# Patient Record
Sex: Male | Born: 1995 | Race: Black or African American | Hispanic: No | Marital: Single | Smoking: Current every day smoker
Health system: Southern US, Community
[De-identification: ages and names within clinical notes are randomized; demographics above are authoritative.]

## PROBLEM LIST (undated history)

## (undated) DIAGNOSIS — J45909 Unspecified asthma, uncomplicated: Secondary | ICD-10-CM

---
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
## 2015-03-21 ENCOUNTER — Emergency Department (HOSPITAL_COMMUNITY)
Admission: EM | Admit: 2015-03-21 | Discharge: 2015-03-21 | Disposition: A | Discharge status: Home / Self Care | Payer: Self-pay | Attending: Emergency Medicine | Admitting: Emergency Medicine

## 2015-03-21 ENCOUNTER — Encounter (HOSPITAL_COMMUNITY): Payer: Self-pay | Admitting: Nurse Practitioner

## 2015-03-21 ENCOUNTER — Emergency Department (HOSPITAL_COMMUNITY): Payer: Self-pay

## 2015-03-21 DIAGNOSIS — Y9289 Other specified places as the place of occurrence of the external cause: Secondary | ICD-10-CM | POA: Insufficient documentation

## 2015-03-21 DIAGNOSIS — W228XXA Striking against or struck by other objects, initial encounter: Secondary | ICD-10-CM | POA: Insufficient documentation

## 2015-03-21 DIAGNOSIS — S62326A Displaced fracture of shaft of fifth metacarpal bone, right hand, initial encounter for closed fracture: Secondary | ICD-10-CM | POA: Insufficient documentation

## 2015-03-21 DIAGNOSIS — Y998 Other external cause status: Secondary | ICD-10-CM | POA: Insufficient documentation

## 2015-03-21 DIAGNOSIS — J45909 Unspecified asthma, uncomplicated: Secondary | ICD-10-CM | POA: Insufficient documentation

## 2015-03-21 DIAGNOSIS — S62339K Displaced fracture of neck of unspecified metacarpal bone, subsequent encounter for fracture with nonunion: Secondary | ICD-10-CM

## 2015-03-21 DIAGNOSIS — Z720 Tobacco use: Secondary | ICD-10-CM | POA: Insufficient documentation

## 2015-03-21 DIAGNOSIS — Y9389 Activity, other specified: Secondary | ICD-10-CM | POA: Insufficient documentation

## 2015-03-21 MED ORDER — HYDROCODONE-ACETAMINOPHEN 5-325 MG PO TABS: 1.0000 | ORAL_TABLET | Freq: Four times a day (QID) | ORAL | Status: DC | PRN

## 2015-03-21 NOTE — ED Notes (Signed)
Pt had R hand boxer fracture several months ago and has re-injured it several times since and would like the doctor to check the hand today before he works with it. No pain now. Does have some intermittent pain. cms inact

## 2015-03-21 NOTE — Discharge Instructions (Signed)
You have a Boxer's fracture that has not healed appropriately.  Please wear splint and follow up closely with Dr. Mina Marble for further care.    Boxer's Fracture A boxer's fracture is a break (fracture) of the bone in your hand that connects your little finger to your wrist (fifth metacarpal). This type of fracture usually happens at the end of the bone, closest to the little finger. The knuckle is often pushed down by the impact. In some cases, only a splint or brace is needed, or you may need a cast. Casting or splinting may include taping your injured finger to the next finger (buddy taping). You may need surgery to repair the fracture. This may involve the use of wires, screws, or plates to hold the bone pieces in place.  CAUSES This injury may be caused by:   Hitting an object with a clenched fist.  A hard, direct hit to the hand.  An injury that crushes the hand. RISK FACTORS This injury is more likely to occur if:  You are in a fistfight.  You have certain bone diseases. SYMPTOMS Symptoms of this type of fracture develop soon after the injury. Symptoms may include:  Swelling of the hand.  Pain.  Pain when moving the fifth finger or touching the hand.  Abnormal position of the finger.  Not being able to move the finger.  A shortened finger.  A finger knuckle that looks sunken in. DIAGNOSIS This injury may be diagnosed based on your symptoms, especially if you had a recent hand injury. Your health care provider will perform a physical exam, and you may also have X-rays to confirm the diagnosis. TREATMENT  Treatment for this injury depends on how severe it is. Possible treatments include:  Closed reduction. If your bone is stable and can be moved back into place, you may only need to wear a cast or splint or have buddy taping.  Open reduction with internal fixation (ORIF). This may be needed if your fracture is far out of place or goes through the joint surface of the  bone. This treatment involves open surgery to move your bones back into the right position. Screws, wires, or plates may be used to stabilize the fracture. You may need to wear a cast or a splint for several weeks. You will also need to have follow-up X-rays to make sure that the bone is healing well and staying in position. After you no longer need the cast or splint, you may need physical therapy. This will help you to regain full movement and strength in your hand.  HOME CARE INSTRUCTIONS If You Have a Cast:  Do not stick anything inside the cast to scratch your skin. Doing that increases your risk of infection.  Check the skin around the cast every day. Report any concerns to your health care provider. You may put lotion on dry skin around the edges of the cast. Do not apply lotion to the skin underneath the cast. If You Have a Splint:  Wear it as directed by your health care provider. Remove it only as directed by your health care provider.  Loosen the splint if your fingers become numb and tingle, or if they turn cold and blue. Bathing  Cover the cast or splint with a watertight plastic bag to protect it from water while you take a bath or a shower. Do not let the cast or splint get wet. Managing Pain, Stiffness, and Swelling  If directed, apply ice to the  injured area (if you have a splint, not a cast):  Put ice in a plastic bag.  Place a towel between your skin and the bag.  Leave the ice on for 20 minutes, 2-3 times a day.  Move your fingers often to avoid stiffness and to lessen swelling.  Raise the injured area above the level of your heart while you are sitting or lying down. Driving  Do not drive or operate heavy machinery while taking pain medicine.  Do not drive while wearing a cast or splint on a hand or foot that you use for driving. Activity  Return to your normal activities as directed by your health care provider. Ask your health care provider what activities  are safe for you. General Instructions  Do not put pressure on any part of the cast or splint until it is fully hardened. This may take several hours.  Keep the cast or splint clean and dry.  Do not use any tobacco products, including cigarettes, chewing tobacco, or electronic cigarettes. Tobacco can delay bone healing. If you need help quitting, ask your health care provider.  Take medicines only as directed by your health care provider.  Keep all follow-up visits as directed by your health care provider. This is important. SEEK MEDICAL CARE IF:  Your pain is getting worse.  You have redness, swelling, or pain in the injured area.  You have fluid, blood, or pus coming from under your cast or splint.  You notice a bad smell coming from under your cast or splint.  You have a fever.  Your cast or splint feels too tight or too loose.  You cast is coming apart. SEEK IMMEDIATE MEDICAL CARE IF:  You develop a rash.  You have trouble breathing.  Your skin or nails on your injured hand turn blue or gray even after you loosen your splint.  Your injured hand feels cold or becomes numb even after you loosen your splint.  You develop severe pain under the cast or in your hand.   This information is not intended to replace advice given to you by your health care provider. Make sure you discuss any questions you have with your health care provider.   Document Released: 04/26/2005 Document Revised: 01/15/2015 Document Reviewed: 02/13/2014 Elsevier Interactive Patient Education [employer] Elsevier Inc.

## 2015-03-21 NOTE — ED Provider Notes (Signed)
CSN: [id number]     Arrival date & time 03/21/15  1617 History  By signing my name below, I, Marc Powers, attest that this documentation has been prepared under the direction and in the presence of Marc Helper, PA-C. Electronically Signed: Elon Powers ED Scribe. 03/21/2015. 5:02 PM.    No chief complaint on file.  The history is provided by the patient. No language interpreter was used.   HPI Comments: Marc Powers is a 19 y.o. right hand dominant male who presents to the Emergency Department complaining of intermittently sharp/throbbing right hand pain onset 2-3 weeks ago.  The patient reports punching a dresser several months ago  He was dx'd with boxer's fracture which was splinted and subsequently plated by Dr. Mina Marble.  The hand was heeling well until 2-3 weeks ago, when the patient struck is hand accidentally on a steel doorframe, causing his current complaint.  His primary motivator for coming to the ED today is to obtain imaging to ensure he did not refracture the hand.  He denies numbness.    Past Medical History  Diagnosis Date  . Asthma     as child   Past Surgical History  Procedure Laterality Date  . Open reduction internal fixation (orif) metacarpal Right 10/30/2014    Procedure: OPEN REDUCTION INTERNAL FIXATION (ORIF)RIGHT SMALL  METACARPAL;  Surgeon: Marc Ponder, MD;  Location: [facility];  Service: Orthopedics;  Laterality: Right;   No family history on file. Social History  Substance Use Topics  . Smoking status: Current Every Day Smoker -- 0.25 packs/day    Types: Cigarettes  . Smokeless tobacco: Not on file  . Alcohol Use: No    Review of Systems  Constitutional: Negative for fever.  Musculoskeletal: Positive for arthralgias.      Allergies  Review of patient's allergies indicates no known allergies.  Home Medications   Prior to Admission medications   Medication Sig Start Date End Date Taking? Authorizing Provider   oxyCODONE-acetaminophen (ROXICET) 5-325 MG per tablet Take 1 tablet by mouth every 4 (four) hours as needed for severe pain. 10/30/14   Marc Ponder, MD   There were no vitals taken for this visit. Physical Exam  Constitutional: He is oriented to person, place, and time. He appears well-developed and well-nourished. No distress.  HENT:  Head: Normocephalic and atraumatic.  Eyes: Conjunctivae and EOM are normal.  Neck: Neck supple. No tracheal deviation present.  Cardiovascular: Normal rate.   Pulmonary/Chest: Effort normal. No respiratory distress.  Musculoskeletal: Normal range of motion.  Right wrist: with FROM.  Radial pulse is 2+.   Right hand: FROM.  No reproducible pain.  Brisk cap refill.  Well-healing surgical scar at the 5th metacarpal.  No signs of infection.     Neurological: He is alert and oriented to person, place, and time.  Skin: Skin is warm and dry.  Psychiatric: He has a normal mood and affect. His behavior is normal.  Nursing note and vitals reviewed.   ED Course  Procedures (including critical care time)  DIAGNOSTIC STUDIES: Oxygen Saturation is 99% on RA, normal by my interpretation.    COORDINATION OF CARE:   4:44 PM Discussed plan to obtain imaging of right hand.  Patient acknowledges and agrees with plan.   5:13 PM Pt has reinjured his R hand after suffering a boxer's fracture recently.  Xray demonstrates nonunion of the fifth metacarpal fx which may be due to reinjury, persistent movement or to delay healing. Thefore, pt will  be placed in an ulnar gutter and will need to f/u with Dr. Mina MarbleWeingold outpt for further care.  Pt made aware of plan.     5:20 PM Pt sts he would like to use his splint at home and agrees to f;u with DR. Weingold next week.  Imaging Review Dg Hand Complete Right  03/21/2015  CLINICAL DATA:  The patient had surgery for a boxer's a fracture in June of 2016; new injury of the right hand when a door with slammed on the hand a  week ago. EXAM: RIGHT HAND - COMPLETE 3+ VIEW COMPARISON:  Right hand series of Sep 25, 2014 FINDINGS: The fracture line in the midshaft of the fifth metacarpal remains visible. There is some periosteal reaction which does not completely bridge the fracture line. The angulation is moderate but not increased since the previous study. The metallic side plate and cortical screws are intact. The first through fourth metacarpals are intact. The phalanges are intact. The bones of the wrist are unremarkable. There is mild soft tissue swelling over the hypothenar region. 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 The metallic hardware appears intact. Orthopedic evaluation is recommended. Electronically Signed   By: Marc  Powers M.D.   On: 03/21/2015 17:06   I have personally reviewed and evaluated these images and lab results as part of my medical decision-making.    MDM   Final diagnoses:  Boxer's fracture with nonunion    BP 150/85 mmHg  Pulse 75  Temp(Src) 98.9 F (37.2 C) (Oral)  Resp 18  SpO2 99%   I personally performed the services described in this documentation, which was scribed in my presence. The recorded information has been reviewed and is accurate.      Marc HelperBowie Nester Bachus, PA-C 03/21/15 1717  Marc HelperBowie Marc Hellberg, PA-C 03/21/15 1720  Marc BaptistEmily Roe Nguyen, MD 03/23/15 [phone]

## 2016-10-21 ENCOUNTER — Emergency Department (HOSPITAL_COMMUNITY)
Admission: EM | Admit: 2016-10-21 | Discharge: 2016-10-21 | Disposition: A | Discharge status: Home / Self Care | Payer: No Typology Code available for payment source | Attending: Emergency Medicine | Admitting: Emergency Medicine

## 2016-10-21 ENCOUNTER — Encounter (HOSPITAL_COMMUNITY): Payer: Self-pay | Admitting: Emergency Medicine

## 2016-10-21 ENCOUNTER — Emergency Department (HOSPITAL_COMMUNITY): Payer: No Typology Code available for payment source

## 2016-10-21 DIAGNOSIS — Y999 Unspecified external cause status: Secondary | ICD-10-CM | POA: Diagnosis not present

## 2016-10-21 DIAGNOSIS — Y92410 Unspecified street and highway as the place of occurrence of the external cause: Secondary | ICD-10-CM | POA: Insufficient documentation

## 2016-10-21 DIAGNOSIS — M25512 Pain in left shoulder: Secondary | ICD-10-CM

## 2016-10-21 DIAGNOSIS — Z79899 Other long term (current) drug therapy: Secondary | ICD-10-CM | POA: Diagnosis not present

## 2016-10-21 DIAGNOSIS — F17210 Nicotine dependence, cigarettes, uncomplicated: Secondary | ICD-10-CM | POA: Insufficient documentation

## 2016-10-21 DIAGNOSIS — J45909 Unspecified asthma, uncomplicated: Secondary | ICD-10-CM | POA: Insufficient documentation

## 2016-10-21 DIAGNOSIS — Y939 Activity, unspecified: Secondary | ICD-10-CM | POA: Insufficient documentation

## 2016-10-21 DIAGNOSIS — S4992XA Unspecified injury of left shoulder and upper arm, initial encounter: Secondary | ICD-10-CM | POA: Diagnosis present

## 2016-10-21 MED ORDER — NAPROXEN 500 MG PO TABS: 500.0000 mg | ORAL_TABLET | Freq: Two times a day (BID) | ORAL | 0 refills | Status: DC
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
## 2016-10-21 NOTE — ED Notes (Signed)
SLING TO LEFT UPPER EXT INTACT. PT TOLERATED/ GOOD CMS

## 2016-10-21 NOTE — ED Notes (Signed)
ICE PACK GIVEN AT DISCHARGE

## 2016-10-21 NOTE — Discharge Instructions (Signed)
Ice your shoulder several times a day. Naprosyn for pain and inflammation. Sling for comfort. Follow up with orthopedics for further evaluation and treatment.

## 2017-03-09 ENCOUNTER — Encounter (HOSPITAL_COMMUNITY): Payer: Self-pay | Admitting: Emergency Medicine

## 2017-03-09 ENCOUNTER — Emergency Department (HOSPITAL_COMMUNITY)
Admission: EM | Admit: 2017-03-09 | Discharge: 2017-03-09 | Disposition: A | Discharge status: Home / Self Care | Payer: Self-pay | Attending: Emergency Medicine | Admitting: Emergency Medicine

## 2017-03-09 DIAGNOSIS — M79602 Pain in left arm: Secondary | ICD-10-CM | POA: Diagnosis not present

## 2017-03-09 DIAGNOSIS — F17210 Nicotine dependence, cigarettes, uncomplicated: Secondary | ICD-10-CM | POA: Insufficient documentation

## 2017-03-09 DIAGNOSIS — W260XXA Contact with knife, initial encounter: Secondary | ICD-10-CM | POA: Insufficient documentation

## 2017-03-09 DIAGNOSIS — S61412A Laceration without foreign body of left hand, initial encounter: Secondary | ICD-10-CM | POA: Insufficient documentation

## 2017-03-09 DIAGNOSIS — J45909 Unspecified asthma, uncomplicated: Secondary | ICD-10-CM | POA: Insufficient documentation

## 2017-03-09 DIAGNOSIS — Y929 Unspecified place or not applicable: Secondary | ICD-10-CM | POA: Insufficient documentation

## 2017-03-09 DIAGNOSIS — Y990 Civilian activity done for income or pay: Secondary | ICD-10-CM | POA: Insufficient documentation

## 2017-03-09 DIAGNOSIS — Y9389 Activity, other specified: Secondary | ICD-10-CM | POA: Insufficient documentation

## 2017-03-09 DIAGNOSIS — T148XXA Other injury of unspecified body region, initial encounter: Secondary | ICD-10-CM | POA: Diagnosis not present

## 2017-03-09 MED ORDER — IBUPROFEN 800 MG PO TABS: 800.0000 mg | ORAL_TABLET | Freq: Three times a day (TID) | ORAL | 0 refills | Status: DC | PRN

## 2017-03-09 MED ORDER — LIDOCAINE HCL 1 % IJ SOLN
INTRAMUSCULAR | Status: AC
  Administered 2017-03-09: 09:00:00
  Filled 2017-03-09: qty 20

## 2017-03-09 MED ORDER — LIDOCAINE HCL (PF) 2 % IJ SOLN
INTRAMUSCULAR | Status: AC
  Filled 2017-03-09: qty 10

## 2017-03-09 NOTE — ED Notes (Signed)
PA at bedside performing procedure.

## 2017-03-09 NOTE — ED Provider Notes (Signed)
[facility]-EMERGENCY DEPT Provider Note   CSN: [id number] Arrival date & time: 03/09/17  0732     History   Chief Complaint Chief Complaint  Patient presents with  . Extremity Laceration    HPI Marc Powers is a 21 y.o. male.  HPI Patient presents to the emergency department with a laceration to his left hand the patient states he was at work using a box cutter when he cut it across the box slicing his left palm. the patient states that he applied pressure and put on a dressing and then came to the emergency department. patient states he does not have any other injuries. Past Medical History:  Diagnosis Date  . Asthma    as child    There are no active problems to display for this patient.   Past Surgical History:  Procedure Laterality Date  . OPEN REDUCTION INTERNAL FIXATION (ORIF) METACARPAL Right 10/30/2014   Procedure: OPEN REDUCTION INTERNAL FIXATION (ORIF)RIGHT SMALL  METACARPAL;  Surgeon: Dairl Ponder, MD;  Location: [facility];  Service: Orthopedics;  Laterality: Right;       Home Medications    Prior to Admission medications   Medication Sig Start Date End Date Taking? Authorizing Provider  ibuprofen (ADVIL,MOTRIN) 800 MG tablet Take 1 tablet (800 mg total) by mouth every 8 (eight) hours as needed. 03/09/17   Kelon Easom, Cristal Deer, PA-C  naproxen (NAPROSYN) 500 MG tablet Take 1 tablet (500 mg total) by mouth 2 (two) times daily. 10/21/16   Jaynie Crumble, PA-C    Family History Family History  Problem Relation Age of Onset  . Hypertension Mother   . Hypertension Father   . Stroke Other     Social History Social History  Substance Use Topics  . Smoking status: Current Every Day Smoker    Packs/day: 0.25    Types: Cigarettes  . Smokeless tobacco: Never Used  . Alcohol use No     Allergies   Patient has no known allergies.   Review of Systems Review of Systems All other systems negative except  as documented in the HPI. All pertinent positives and negatives as reviewed in the HPI.  Physical Exam Updated Vital Signs BP (!) 146/91 (BP Location: Right Arm)   Pulse 78   Temp 100.1 F (37.8 C) (Oral)   Resp 16   SpO2 100%   Physical Exam  Constitutional: He is oriented to person, place, and time. He appears well-developed and well-nourished. No distress.  HENT:  Head: Normocephalic and atraumatic.  Eyes: Pupils are equal, round, and reactive to light.  Pulmonary/Chest: Effort normal.  Neurological: He is alert and oriented to person, place, and time.  Skin: Skin is warm and dry.  Psychiatric: He has a normal mood and affect.  Nursing note and vitals reviewed.    ED Treatments / Results  Labs (all labs ordered are listed, but only abnormal results are displayed) Labs Reviewed - No data to display  EKG  EKG Interpretation None       Radiology No results found.  Procedures Procedures (including critical care time)  Medications Ordered in ED Medications  lidocaine (XYLOCAINE) 1 % (with pres) injection (  Given 03/09/17 0920)     Initial Impression / Assessment and Plan / ED Course  I have reviewed the triage vital signs and the nursing notes.  Pertinent labs & imaging results that were available during my care of the patient were reviewed by me and considered in my medical  decision making (see chart for details).     LACERATION REPAIR Performed by: Carlyle DollyLAWYER,Johneisha Broaden W Authorized by: Carlyle DollyLAWYER,Abdulraheem Pineo W Consent: Verbal consent obtained. Risks and benefits: risks, benefits and alternatives were discussed Consent given by: patient Patient identity confirmed: provided demographic data Prepped and Draped in normal sterile fashion Wound explored  Laceration Location: Left palm medially  Laceration Length: 5 cm  No Foreign Bodies seen or palpated  Anesthesia: local infiltration  Local anesthetic: lidocaine 2 % without epinephrine  Anesthetic  total: 5 ml  Irrigation method: syringe Amount of cleaning: standard  Skin closure: 4-0 Prolene  Number of sutures: 8  Technique: Simple interrupted  Patient tolerance: Patient tolerated the procedure well with no immediate complications.   Final Clinical Impressions(s) / ED Diagnoses   Final diagnoses:  Laceration of left hand without foreign body, initial encounter    New Prescriptions Discharge Medication List as of 03/09/2017  9:36 AM       Charlestine NightLawyer, Anthonia Monger, PA-C 03/09/17 1601    Benjiman CorePickering, Nathan, MD 03/10/17 [phone]

## 2017-03-09 NOTE — ED Notes (Signed)
Bed: [id number] Expected date:  Expected time:  Means of arrival:  Comments: 21 yo M  Hand laceration

## 2017-03-09 NOTE — ED Triage Notes (Signed)
Per EMS-states cut left palm on box cutter at work this am-2 inch laceration-bleeding controlled

## 2017-03-09 NOTE — Discharge Instructions (Signed)
Return here as needed.  Have sutures out in 14 days.  Keep the area clean and dry keep the area covered at work

## 2017-10-21 ENCOUNTER — Encounter (HOSPITAL_COMMUNITY): Payer: Self-pay | Admitting: Emergency Medicine

## 2017-10-21 ENCOUNTER — Ambulatory Visit (HOSPITAL_COMMUNITY)
Admission: EM | Admit: 2017-10-21 | Discharge: 2017-10-21 | Disposition: A | Discharge status: Home / Self Care | Payer: Self-pay | Attending: Family Medicine | Admitting: Family Medicine

## 2017-10-21 DIAGNOSIS — W19XXXA Unspecified fall, initial encounter: Secondary | ICD-10-CM

## 2017-10-21 DIAGNOSIS — M25572 Pain in left ankle and joints of left foot: Secondary | ICD-10-CM

## 2017-10-21 MED ORDER — IBUPROFEN 800 MG PO TABS: 800.0000 mg | ORAL_TABLET | Freq: Three times a day (TID) | ORAL | 0 refills | Status: DC | PRN

## 2017-10-21 NOTE — ED Provider Notes (Signed)
[facility]    CSN: [id number] Arrival date & time: 10/21/17  1008     History   Chief Complaint Chief Complaint  Patient presents with  . Ankle Pain    HPI Marc Powers is a 22 y.o. male.   HPI  Patient is here for left ankle pain.  He states he was running around the house and playing with his 3-year-old son last night.  He tripped, son slammed the door forcibly on his left ankle.  It was an accident.  His ankle still painful.  He is limping.  He went to work today and was sent home.  He needs a note.  He states he does not think it is broken.  Is not swollen.  Pain is in the outside of the the left ankle.  He put some ice on it last night. Full range of motion.  He is in good health with no medical problems otherwise.  He does work on his feet extensively.  Past Medical History:  Diagnosis Date  . Asthma    as child    There are no active problems to display for this patient.   Past Surgical History:  Procedure Laterality Date  . OPEN REDUCTION INTERNAL FIXATION (ORIF) METACARPAL Right 10/30/2014   Procedure: OPEN REDUCTION INTERNAL FIXATION (ORIF)RIGHT SMALL  METACARPAL;  Surgeon: Dairl Ponder, MD;  Location: [facility];  Service: Orthopedics;  Laterality: Right;       Home Medications    Prior to Admission medications   Medication Sig Start Date End Date Taking? Authorizing Provider  ibuprofen (ADVIL,MOTRIN) 800 MG tablet Take 1 tablet (800 mg total) by mouth every 8 (eight) hours as needed. 10/21/17   Eustace Moore, MD    Family History Family History  Problem Relation Age of Onset  . Hypertension Mother   . Hypertension Father   . Stroke Other     Social History Social History   Tobacco Use  . Smoking status: Current Every Day Smoker    Packs/day: 0.25    Types: Cigarettes  . Smokeless tobacco: Never Used  Substance Use Topics  . Alcohol use: No  . Drug use: No     Allergies   Patient has no known  allergies.   Review of Systems Review of Systems  Constitutional: Negative for chills and fever.  HENT: Negative for ear pain and sore throat.   Eyes: Negative for pain and visual disturbance.  Respiratory: Negative for cough and shortness of breath.   Cardiovascular: Negative for chest pain and palpitations.  Gastrointestinal: Negative for abdominal pain and vomiting.  Genitourinary: Negative for dysuria and hematuria.  Musculoskeletal: Positive for gait problem. Negative for arthralgias and back pain.  Skin: Negative for color change and rash.  Neurological: Negative for seizures and syncope.  All other systems reviewed and are negative.    Physical Exam Triage Vital Signs ED Triage Vitals [10/21/17 1035]  Enc Vitals Group     BP 130/74     Pulse Rate 71     Resp 18     Temp 98.6 F (37 C)     Temp Source Oral     SpO2 99 %     Weight      Height      Head Circumference      Peak Flow      Pain Score      Pain Loc      Pain Edu?  Excl. in GC?    No data found.  Updated Vital Signs BP 130/74 (BP Location: Left Arm)   Pulse 71   Temp 98.6 F (37 C) (Oral)   Resp 18   SpO2 99%   Visual Acuity Right Eye Distance:   Left Eye Distance:   Bilateral Distance:    Right Eye Near:   Left Eye Near:    Bilateral Near:     Physical Exam  Constitutional: He appears well-developed and well-nourished. No distress.  HENT:  Head: Normocephalic and atraumatic.  Mouth/Throat: Oropharynx is clear and moist.  Eyes: Pupils are equal, round, and reactive to light. Conjunctivae are normal.  Neck: Normal range of motion.  Cardiovascular: Normal rate.  Pulmonary/Chest: Effort normal. No respiratory distress.  Abdominal: Soft. He exhibits no distension.  Musculoskeletal: Normal range of motion. He exhibits no edema.  Right ankle is normal.  No palpable deformity or tenderness good range of motion and strength.  Left ankle has tenderness over the distal fibula about 6 cm  from the tip.  No bony deformity.  Moderate by tenderness.  Full range of motion.  Pain with inversion.  Antalgic gait.  Neurological: He is alert.  Skin: Skin is warm and dry.     UC Treatments / Results  Labs (all labs ordered are listed, but only abnormal results are displayed) Labs Reviewed - No data to display  EKG None  Radiology No results found.  Procedures Procedures (including critical care time)  Medications Ordered in UC Medications - No data to display  Initial Impression / Assessment and Plan / UC Course  I have reviewed the triage vital signs and the nursing notes.  Pertinent labs & imaging results that were available during my care of the patient were reviewed by me and considered in my medical decision making (see chart for details).     Discussed this is unlikely fracture.  I think he has a contusion.  He may be some ankle tendinitis.  Discussed anti-inflammatories rest ice and gentle range of motion.  Return if not better in a couple days. Final Clinical Impressions(s) / UC Diagnoses   Final diagnoses:  Acute left ankle pain     Discharge Instructions     Ice Ibuprofen Limit walking a couple of days Return for problems   ED Prescriptions    Medication Sig Dispense Auth. Provider   ibuprofen (ADVIL,MOTRIN) 800 MG tablet Take 1 tablet (800 mg total) by mouth every 8 (eight) hours as needed. 30 tablet Eustace MooreNelson, Karmine Kauer Sue, MD     Controlled Substance Prescriptions [place] Controlled Substance Registry consulted? No   Eustace MooreNelson, Arianny Pun Sue, MD 10/21/17 1106

## 2017-10-21 NOTE — Discharge Instructions (Signed)
Ice Ibuprofen Limit walking a couple of days Return for problems

## 2017-10-21 NOTE — ED Triage Notes (Signed)
Pt sts left ankle pain after getting slammed in door

## 2017-11-25 ENCOUNTER — Ambulatory Visit (HOSPITAL_COMMUNITY)
Admission: EM | Admit: 2017-11-25 | Discharge: 2017-11-25 | Disposition: A | Discharge status: Home / Self Care | Payer: Self-pay | Attending: Internal Medicine | Admitting: Internal Medicine

## 2017-11-25 ENCOUNTER — Ambulatory Visit (INDEPENDENT_AMBULATORY_CARE_PROVIDER_SITE_OTHER): Payer: Self-pay

## 2017-11-25 ENCOUNTER — Encounter (HOSPITAL_COMMUNITY): Payer: Self-pay | Admitting: Emergency Medicine

## 2017-11-25 DIAGNOSIS — M79641 Pain in right hand: Secondary | ICD-10-CM

## 2017-11-25 DIAGNOSIS — S60221A Contusion of right hand, initial encounter: Secondary | ICD-10-CM

## 2017-11-25 NOTE — ED Triage Notes (Signed)
Pt here for right hand pain after hitting on pool; pt sts hx of sx on same hand

## 2017-11-25 NOTE — Discharge Instructions (Addendum)
Return if any problems.

## 2017-11-26 NOTE — ED Provider Notes (Signed)
[facility]    CSN: [id number] Arrival date & time: 11/25/17  1843     History   Chief Complaint Chief Complaint  Patient presents with  . Hand Pain    HPI Marc Powers is a 22 y.o. male.   The history is provided by the patient.  Hand Pain  This is a new problem. The current episode started yesterday. The problem occurs constantly. The problem has been gradually worsening. Nothing aggravates the symptoms. Nothing relieves the symptoms. He has tried nothing for the symptoms. The treatment provided no relief.  Pt slipped and fell and hit hand on edge of pool.  Pt hit his hand.  Pt complains of swelling and pain  Past Medical History:  Diagnosis Date  . Asthma    as child    There are no active problems to display for this patient.   Past Surgical History:  Procedure Laterality Date  . OPEN REDUCTION INTERNAL FIXATION (ORIF) METACARPAL Right 10/30/2014   Procedure: OPEN REDUCTION INTERNAL FIXATION (ORIF)RIGHT SMALL  METACARPAL;  Surgeon: Dairl PonderMatthew Weingold, MD;  Location: [facility];  Service: Orthopedics;  Laterality: Right;       Home Medications    Prior to Admission medications   Medication Sig Start Date End Date Taking? Authorizing Provider  ibuprofen (ADVIL,MOTRIN) 800 MG tablet Take 1 tablet (800 mg total) by mouth every 8 (eight) hours as needed. 10/21/17   Eustace MooreNelson, Yvonne Sue, MD    Family History Family History  Problem Relation Age of Onset  . Hypertension Mother   . Hypertension Father   . Stroke Other     Social History Social History   Tobacco Use  . Smoking status: Current Every Day Smoker    Packs/day: 0.25    Types: Cigarettes  . Smokeless tobacco: Never Used  Substance Use Topics  . Alcohol use: No  . Drug use: No     Allergies   Patient has no known allergies.   Review of Systems Review of Systems  All other systems reviewed and are negative.    Physical Exam Triage Vital Signs ED Triage  Vitals [11/25/17 1936]  Enc Vitals Group     BP (!) 154/73     Pulse Rate 69     Resp 18     Temp 98.2 F (36.8 C)     Temp Source Oral     SpO2 98 %     Weight      Height      Head Circumference      Peak Flow      Pain Score      Pain Loc      Pain Edu?      Excl. in GC?    No data found.  Updated Vital Signs BP (!) 154/73 (BP Location: Left Arm)   Pulse 69   Temp 98.2 F (36.8 C) (Oral)   Resp 18   SpO2 98%   Visual Acuity Right Eye Distance:   Left Eye Distance:   Bilateral Distance:    Right Eye Near:   Left Eye Near:    Bilateral Near:     Physical Exam  Constitutional: He appears well-developed and well-nourished.  Musculoskeletal: He exhibits tenderness.  Swollen tender hand,  Pain over 5th metacarpal,  nv and ns intact   Neurological: He is alert.  Skin: Skin is warm.  Psychiatric: He has a normal mood and affect.  Nursing note and vitals reviewed.  UC Treatments / Results  Labs (all labs ordered are listed, but only abnormal results are displayed) Labs Reviewed - No data to display  EKG None  Radiology Dg Hand Complete Right  Result Date: 11/25/2017 CLINICAL DATA:  Right hand pain after hitting is hand on the side of a swimming pool last night. EXAM: RIGHT HAND - COMPLETE 3+ VIEW COMPARISON:  03/21/2015. FINDINGS: Interval healing of the previously demonstrated 5th metacarpal fracture with screw and plate fixation. No acute fracture or dislocation seen. IMPRESSION: No acute fracture. Electronically Signed   By: Beckie Salts M.D.   On: 11/25/2017 19:56    Procedures Procedures (including critical care time)  Medications Ordered in UC Medications - No data to display  Initial Impression / Assessment and Plan / UC Course  I have reviewed the triage vital signs and the nursing notes.  Pertinent labs & imaging results that were available during my care of the patient were reviewed by me and considered in my medical decision making (see  chart for details).      Final Clinical Impressions(s) / UC Diagnoses   Final diagnoses:  Contusion of right hand, initial encounter     Discharge Instructions     Return if any problems.   ED Prescriptions    None     Controlled Substance Prescriptions [place] Controlled Substance Registry consulted? Not Applicable  An After Visit Summary was printed and given to the patient.    Elson Areas, [place] 11/26/17 1728

## 2018-07-11 ENCOUNTER — Ambulatory Visit (HOSPITAL_COMMUNITY)
Admission: EM | Admit: 2018-07-11 | Discharge: 2018-07-11 | Disposition: A | Discharge status: Home / Self Care | Payer: Medicaid Other | Attending: Family Medicine | Admitting: Family Medicine

## 2018-07-11 ENCOUNTER — Other Ambulatory Visit: Payer: Self-pay

## 2018-07-11 ENCOUNTER — Encounter (HOSPITAL_COMMUNITY): Payer: Self-pay | Admitting: Emergency Medicine

## 2018-07-11 DIAGNOSIS — G43019 Migraine without aura, intractable, without status migrainosus: Secondary | ICD-10-CM

## 2018-07-11 MED ORDER — KETOROLAC TROMETHAMINE 60 MG/2ML IM SOLN: 60.0000 mg | Freq: Once | INTRAMUSCULAR | Status: DC

## 2018-07-11 MED ORDER — KETOROLAC TROMETHAMINE 60 MG/2ML IM SOLN
INTRAMUSCULAR | Status: AC
  Filled 2018-07-11: qty 2

## 2018-07-11 MED ORDER — TOPIRAMATE 50 MG PO TABS: 50.0000 mg | ORAL_TABLET | Freq: Every day | ORAL | 1 refills | Status: DC

## 2018-07-11 NOTE — ED Triage Notes (Signed)
The patient presented to the [facility] with a complaint of a headache x 3 days. The patient denied any hx of migraine headaches.

## 2018-07-11 NOTE — ED Provider Notes (Signed)
[facility]    CSN: [id number] Arrival date & time: 07/11/18  1955     History   Chief Complaint Chief Complaint  Patient presents with  . Headache    HPI Marc Powers is a 23 y.o. male.   The patient presented to the [facility] with a complaint of a headache x 3 days. The patient denied any hx of migraine headaches.   He has had no head trauma or fever.  No cough, N, V.  Some photophonia and photophobia.  Pain is right temporal and throbbing.    He works at [employer].  Unable to work today.  Able to sleep last couple nights fitfully.  Drove himself over to the clinic today.     Past Medical History:  Diagnosis Date  . Asthma    as child    There are no active problems to display for this patient.   Past Surgical History:  Procedure Laterality Date  . OPEN REDUCTION INTERNAL FIXATION (ORIF) METACARPAL Right 10/30/2014   Procedure: OPEN REDUCTION INTERNAL FIXATION (ORIF)RIGHT SMALL  METACARPAL;  Surgeon: Dairl Ponder, MD;  Location: [facility];  Service: Orthopedics;  Laterality: Right;       Home Medications    Prior to Admission medications   Medication Sig Start Date End Date Taking? Authorizing Provider  ibuprofen (ADVIL,MOTRIN) 800 MG tablet Take 1 tablet (800 mg total) by mouth every 8 (eight) hours as needed. 10/21/17  Yes Eustace Moore, MD  topiramate (TOPAMAX) 50 MG tablet Take 1 tablet (50 mg total) by mouth at bedtime. 07/11/18   Elvina Sidle, MD    Family History Family History  Problem Relation Age of Onset  . Hypertension Mother   . Hypertension Father   . Stroke Other     Social History Social History   Tobacco Use  . Smoking status: Current Every Day Smoker    Packs/day: 0.25    Types: Cigarettes  . Smokeless tobacco: Never Used  Substance Use Topics  . Alcohol use: No  . Drug use: No     Allergies   Patient has no known allergies.   Review of Systems Review of Systems   Physical  Exam Triage Vital Signs ED Triage Vitals  Enc Vitals Group     BP 07/11/18 2013 132/84     Pulse Rate 07/11/18 2013 69     Resp 07/11/18 2013 16     Temp 07/11/18 2013 99.3 F (37.4 C)     Temp Source 07/11/18 2013 Temporal     SpO2 07/11/18 2013 99 %     Weight --      Height --      Head Circumference --      Peak Flow --      Pain Score 07/11/18 2012 7     Pain Loc --      Pain Edu? --      Excl. in [place]? --    No data found.  Updated Vital Signs BP 132/84 (BP Location: Right Arm)   Pulse 69   Temp 99.3 F (37.4 C) (Temporal)   Resp 16   SpO2 99%   Physical Exam   UC Treatments / Results  Labs (all labs ordered are listed, but only abnormal results are displayed) Labs Reviewed - No data to display  EKG None  Radiology No results found.  Procedures Procedures (including critical care time)  Medications Ordered in UC Medications  ketorolac (TORADOL) injection 60 mg (  has no administration in time range)    Initial Impression / Assessment and Plan / UC Course  I have reviewed the triage vital signs and the nursing notes.  Pertinent labs & imaging results that were available during my care of the patient were reviewed by me and considered in my medical decision making (see chart for details).    Final Clinical Impressions(s) / UC Diagnoses   Final diagnoses:  Intractable migraine without aura and without status migrainosus   Discharge Instructions   None    ED Prescriptions    Medication Sig Dispense Auth. Provider   topiramate (TOPAMAX) 50 MG tablet Take 1 tablet (50 mg total) by mouth at bedtime. 10 tablet Elvina Sidle, MD     Controlled Substance Prescriptions [place] Controlled Substance Registry consulted? Not Applicable   Elvina Sidle, MD 07/11/18 2038

## 2019-01-15 ENCOUNTER — Other Ambulatory Visit: Payer: Self-pay

## 2019-01-15 ENCOUNTER — Encounter (HOSPITAL_COMMUNITY): Payer: Self-pay

## 2019-01-15 ENCOUNTER — Ambulatory Visit (HOSPITAL_COMMUNITY)
Admission: EM | Admit: 2019-01-15 | Discharge: 2019-01-15 | Disposition: A | Discharge status: Home / Self Care | Payer: Medicaid Other | Attending: Family Medicine | Admitting: Family Medicine

## 2019-01-15 DIAGNOSIS — L0291 Cutaneous abscess, unspecified: Secondary | ICD-10-CM

## 2019-01-15 DIAGNOSIS — L02811 Cutaneous abscess of head [any part, except face]: Secondary | ICD-10-CM

## 2019-01-15 MED ORDER — IBUPROFEN 800 MG PO TABS: 800.0000 mg | ORAL_TABLET | Freq: Three times a day (TID) | ORAL | 0 refills | Status: DC | PRN

## 2019-01-15 MED ORDER — HYDROCODONE-ACETAMINOPHEN 7.5-325 MG PO TABS: 1.0000 | ORAL_TABLET | Freq: Four times a day (QID) | ORAL | 0 refills | Status: DC | PRN

## 2019-01-15 MED ORDER — SULFAMETHOXAZOLE-TRIMETHOPRIM 800-160 MG PO TABS: 1.0000 | ORAL_TABLET | Freq: Two times a day (BID) | ORAL | 0 refills | Status: AC

## 2019-01-15 NOTE — Discharge Instructions (Signed)
Take the antibiotic 2 times a day.  It is important that you get 2 doses in today. Use a warm compress for 20 minutes every couple of hours Take the hydrocodone as needed for pain today.  Do not drive on hydrocodone.  Take with food. Take the ibuprofen 3 times a day with food for moderate pain.  This is to take while you are trying to work Return if you do not see definite improvement in 2 to 3 days

## 2019-01-15 NOTE — ED Triage Notes (Signed)
Patient presents to Urgent Care with complaints of painful area on the right side of his head that extends down into the top of his neck since 6 days ago. Patient reports he has not taken any medicine for it, has been cleaning it with alcohol and iodine, states it has been draining.

## 2019-01-15 NOTE — ED Provider Notes (Signed)
[facility]    CSN: [id number] Arrival date & time: 01/15/19  0930      History   Chief Complaint Chief Complaint  Patient presents with  . Abscess    HPI Marc ZABALETA is a 23 y.o. male.   HPI  Swelling on the right side of his head that is gradually getting worse for the last 5 or 6 days.  No fever.  He had one area that formed a pustule and drained.  This is no longer draining.  He has swollen area that is painful on his neck behind his ear.  No fever or chills.  No trouble with vision.  Past Medical History:  Diagnosis Date  . Asthma    as child    There are no active problems to display for this patient.   Past Surgical History:  Procedure Laterality Date  . OPEN REDUCTION INTERNAL FIXATION (ORIF) METACARPAL Right 10/30/2014   Procedure: OPEN REDUCTION INTERNAL FIXATION (ORIF)RIGHT SMALL  METACARPAL;  Surgeon: Dairl Ponder, MD;  Location: [facility];  Service: Orthopedics;  Laterality: Right;       Home Medications    Prior to Admission medications   Medication Sig Start Date End Date Taking? Authorizing Provider  HYDROcodone-acetaminophen (NORCO) 7.5-325 MG tablet Take 1 tablet by mouth every 6 (six) hours as needed for moderate pain. 01/15/19   Eustace Moore, MD  ibuprofen (ADVIL) 800 MG tablet Take 1 tablet (800 mg total) by mouth every 8 (eight) hours as needed. 01/15/19   Eustace Moore, MD  sulfamethoxazole-trimethoprim (BACTRIM DS) 800-160 MG tablet Take 1 tablet by mouth 2 (two) times daily for 10 days. 01/15/19 01/25/19  Eustace Moore, MD  topiramate (TOPAMAX) 50 MG tablet Take 1 tablet (50 mg total) by mouth at bedtime. 07/11/18   Elvina Sidle, MD    Family History Family History  Problem Relation Age of Onset  . Hypertension Mother   . Hypertension Father   . Stroke Other     Social History Social History   Tobacco Use  . Smoking status: Current Every Day Smoker    Packs/day: 0.20    Types:  Cigarettes  . Smokeless tobacco: Never Used  Substance Use Topics  . Alcohol use: No  . Drug use: No     Allergies   Patient has no known allergies.   Review of Systems Review of Systems  Constitutional: Negative for chills and fever.  HENT: Negative for ear pain and sore throat.   Eyes: Negative for pain and visual disturbance.  Respiratory: Negative for cough and shortness of breath.   Cardiovascular: Negative for chest pain and palpitations.  Gastrointestinal: Negative for abdominal pain and vomiting.  Genitourinary: Negative for dysuria and hematuria.  Musculoskeletal: Negative for arthralgias and back pain.  Skin: Positive for wound. Negative for color change and rash.  Neurological: Negative for seizures and syncope.  All other systems reviewed and are negative.    Physical Exam Triage Vital Signs ED Triage Vitals  Enc Vitals Group     BP 01/15/19 1028 125/76     Pulse Rate 01/15/19 1028 81     Resp 01/15/19 1028 15     Temp 01/15/19 1028 99.8 F (37.7 C)     Temp Source 01/15/19 1028 Temporal     SpO2 01/15/19 1028 100 %     Weight --      Height --      Head Circumference --  Peak Flow --      Pain Score 01/15/19 1025 8     Pain Loc --      Pain Edu? --      Excl. in GC? --    No data found.  Updated Vital Signs BP 125/76 (BP Location: Right Arm)   Pulse 81   Temp 99.8 F (37.7 C) (Temporal)   Resp 15   SpO2 100%      Physical Exam Constitutional:      General: He is not in acute distress.    Appearance: He is well-developed and normal weight. He is not ill-appearing.  HENT:     Head: Normocephalic and atraumatic.   Eyes:     Conjunctiva/sclera: Conjunctivae normal.     Pupils: Pupils are equal, round, and reactive to light.  Neck:     Musculoskeletal: Normal range of motion.  Cardiovascular:     Rate and Rhythm: Normal rate.  Pulmonary:     Effort: Pulmonary effort is normal. No respiratory distress.  Abdominal:     General:  There is no distension.     Palpations: Abdomen is soft.  Musculoskeletal: Normal range of motion.  Skin:    General: Skin is warm and dry.  Neurological:     Mental Status: He is alert.      UC Treatments / Results  Labs (all labs ordered are listed, but only abnormal results are displayed) Labs Reviewed - No data to display  EKG   Radiology No results found.  Procedures Procedures (including critical care time)  Medications Ordered in UC Medications - No data to display  Initial Impression / Assessment and Plan / UC Course  I have reviewed the triage vital signs and the nursing notes.  Pertinent labs & imaging results that were available during my care of the patient were reviewed by me and considered in my medical decision making (see chart for details).     I do not palpate what feels like a pocket or abscess.  This appears to be an area of cellulitis.  It is raised and very tender but not red.  I am going to treat him with antibiotics.  I recommended a shot of Rocephin which he refused. Final Clinical Impressions(s) / UC Diagnoses   Final diagnoses:  Abscess     Discharge Instructions     Take the antibiotic 2 times a day.  It is important that you get 2 doses in today. Use a warm compress for 20 minutes every couple of hours Take the hydrocodone as needed for pain today.  Do not drive on hydrocodone.  Take with food. Take the ibuprofen 3 times a day with food for moderate pain.  This is to take while you are trying to work Return if you do not see definite improvement in 2 to 3 days   ED Prescriptions    Medication Sig Dispense Auth. Provider   ibuprofen (ADVIL) 800 MG tablet Take 1 tablet (800 mg total) by mouth every 8 (eight) hours as needed. 30 tablet Eustace MooreNelson, Taiwo Fish Sue, MD   HYDROcodone-acetaminophen [facility](NORCO) 7.5-325 MG tablet Take 1 tablet by mouth every 6 (six) hours as needed for moderate pain. 15 tablet Eustace MooreNelson, Aniza Shor Sue, MD    sulfamethoxazole-trimethoprim (BACTRIM DS) 800-160 MG tablet Take 1 tablet by mouth 2 (two) times daily for 10 days. 20 tablet Eustace MooreNelson, Edgar Reisz Sue, MD     Controlled Substance Prescriptions [place] Controlled Substance Registry consulted? Not Applicable   Rica Mastelson, Jolinda Pinkstaff  Collie Siad, MD 01/15/19 2128

## 2019-07-23 ENCOUNTER — Other Ambulatory Visit: Payer: Self-pay

## 2019-07-23 ENCOUNTER — Ambulatory Visit (HOSPITAL_COMMUNITY)
Admission: EM | Admit: 2019-07-23 | Discharge: 2019-07-23 | Disposition: A | Discharge status: Home / Self Care | Payer: Self-pay | Attending: Family Medicine | Admitting: Family Medicine

## 2019-07-23 ENCOUNTER — Encounter (HOSPITAL_COMMUNITY): Payer: Self-pay

## 2019-07-23 DIAGNOSIS — Z0289 Encounter for other administrative examinations: Secondary | ICD-10-CM

## 2019-07-23 DIAGNOSIS — Z7689 Persons encountering health services in other specified circumstances: Secondary | ICD-10-CM

## 2019-07-23 NOTE — Discharge Instructions (Signed)
Ok to return to work per [employer]

## 2019-07-23 NOTE — ED Triage Notes (Signed)
Patient states that he is here to be cleared to go back to work from having covid. Patient states that his employer is requiring a note to clear him. Reports that he had Covid one month ago and currently has no symptoms.

## 2019-07-23 NOTE — ED Provider Notes (Signed)
[place]    CSN: [id number] Arrival date & time: 07/23/19  [place]      History   Chief Complaint Chief Complaint  Patient presents with  . Letter for School/Work    HPI Marc Powers is a 24 y.o. male.   [name] presents with requests for documentation to return to work. He tested positive for covid 19 early February and quarantined for 3 weeks. The health department cleared him of quarantine. He no longer has any symptoms, his sense of taste and smell have returned. No further fevers. No cough. His work asked him to produce a negative test to return to work. He retested on 3/5 but it returned positive still.   ROS per HPI, negative if not otherwise mentioned.      Past Medical History:  Diagnosis Date  . Asthma    as child    There are no problems to display for this patient.   Past Surgical History:  Procedure Laterality Date  . OPEN REDUCTION INTERNAL FIXATION (ORIF) METACARPAL Right 10/30/2014   Procedure: OPEN REDUCTION INTERNAL FIXATION (ORIF)RIGHT SMALL  METACARPAL;  Surgeon: Charlotte Crumb, MD;  Location: [facility];  Service: Orthopedics;  Laterality: Right;       Home Medications    Prior to Admission medications   Medication Sig Start Date End Date Taking? Authorizing Provider  HYDROcodone-acetaminophen (NORCO) 7.5-325 MG tablet Take 1 tablet by mouth every 6 (six) hours as needed for moderate pain. 01/15/19   Raylene Everts, MD  ibuprofen (ADVIL) 800 MG tablet Take 1 tablet (800 mg total) by mouth every 8 (eight) hours as needed. 01/15/19   Raylene Everts, MD  topiramate (TOPAMAX) 50 MG tablet Take 1 tablet (50 mg total) by mouth at bedtime. 07/11/18   Robyn Haber, MD    Family History Family History  Problem Relation Age of Onset  . Hypertension Mother   . Hypertension Father   . Stroke Other     Social History Social History   Tobacco Use  . Smoking status: Current Every Day Smoker   Packs/day: 0.20    Types: Cigarettes  . Smokeless tobacco: Never Used  Substance Use Topics  . Alcohol use: No  . Drug use: No     Allergies   Patient has no known allergies.   Review of Systems Review of Systems   Physical Exam Triage Vital Signs ED Triage Vitals  Enc Vitals Group     BP 07/23/19 1946 137/89     Pulse Rate 07/23/19 1946 78     Resp 07/23/19 1946 16     Temp 07/23/19 1946 98.5 F (36.9 C)     Temp Source 07/23/19 1946 Oral     SpO2 07/23/19 1946 100 %     Weight 07/23/19 1944 180 lb (81.6 kg)     Height 07/23/19 1944 5\' 9"  (1.753 m)     Head Circumference --      Peak Flow --      Pain Score 07/23/19 1944 0     Pain Loc --      Pain Edu? --      Excl. in [place]? --    No data found.  Updated Vital Signs BP 137/89 (BP Location: Left Arm)   Pulse 78   Temp 98.5 F (36.9 C) (Oral)   Resp 16   Ht 5\' 9"  (1.753 m)   Wt 180 lb (81.6 kg)   SpO2 100%  BMI 26.58 kg/m   Visual Acuity Right Eye Distance:   Left Eye Distance:   Bilateral Distance:    Right Eye Near:   Left Eye Near:    Bilateral Near:     Physical Exam Constitutional:      Appearance: He is well-developed.  Cardiovascular:     Rate and Rhythm: Normal rate.  Pulmonary:     Effort: Pulmonary effort is normal.  Skin:    General: Skin is warm and dry.  Neurological:     Mental Status: He is alert and oriented to person, place, and time.      UC Treatments / Results  Labs (all labs ordered are listed, but only abnormal results are displayed) Labs Reviewed - No data to display  EKG   Radiology No results found.  Procedures Procedures (including critical care time)  Medications Ordered in UC Medications - No data to display  Initial Impression / Assessment and Plan / UC Course  I have reviewed the triage vital signs and the nursing notes.  Pertinent labs & imaging results that were available during my care of the patient were reviewed by me and considered in my  medical decision making (see chart for details).     No further symptoms. Has been cleared by health department and per CDC guidelines. Even since last positive 3/5 ok to return per guidelines. Note provided, discussed that not recommended to retest at this time. Patient verbalized understanding and agreeable to plan.   Final Clinical Impressions(s) / UC Diagnoses   Final diagnoses:  Return to work evaluation     Discharge Instructions     Ok to return to work per [employer] guidelines   ED Prescriptions    None     PDMP not reviewed this encounter.   Georgetta Haber, NP 07/23/19 2030

## 2020-03-01 IMAGING — DX DG HAND COMPLETE 3+V*R*
3 series · 3 of 3 positions shown · non-contrast
Comparison: 03/21/2015.

CLINICAL DATA: Right hand pain after hitting is hand on the side of
a swimming pool last night.

EXAM:
RIGHT HAND - COMPLETE 3+ VIEW

[hand pa]
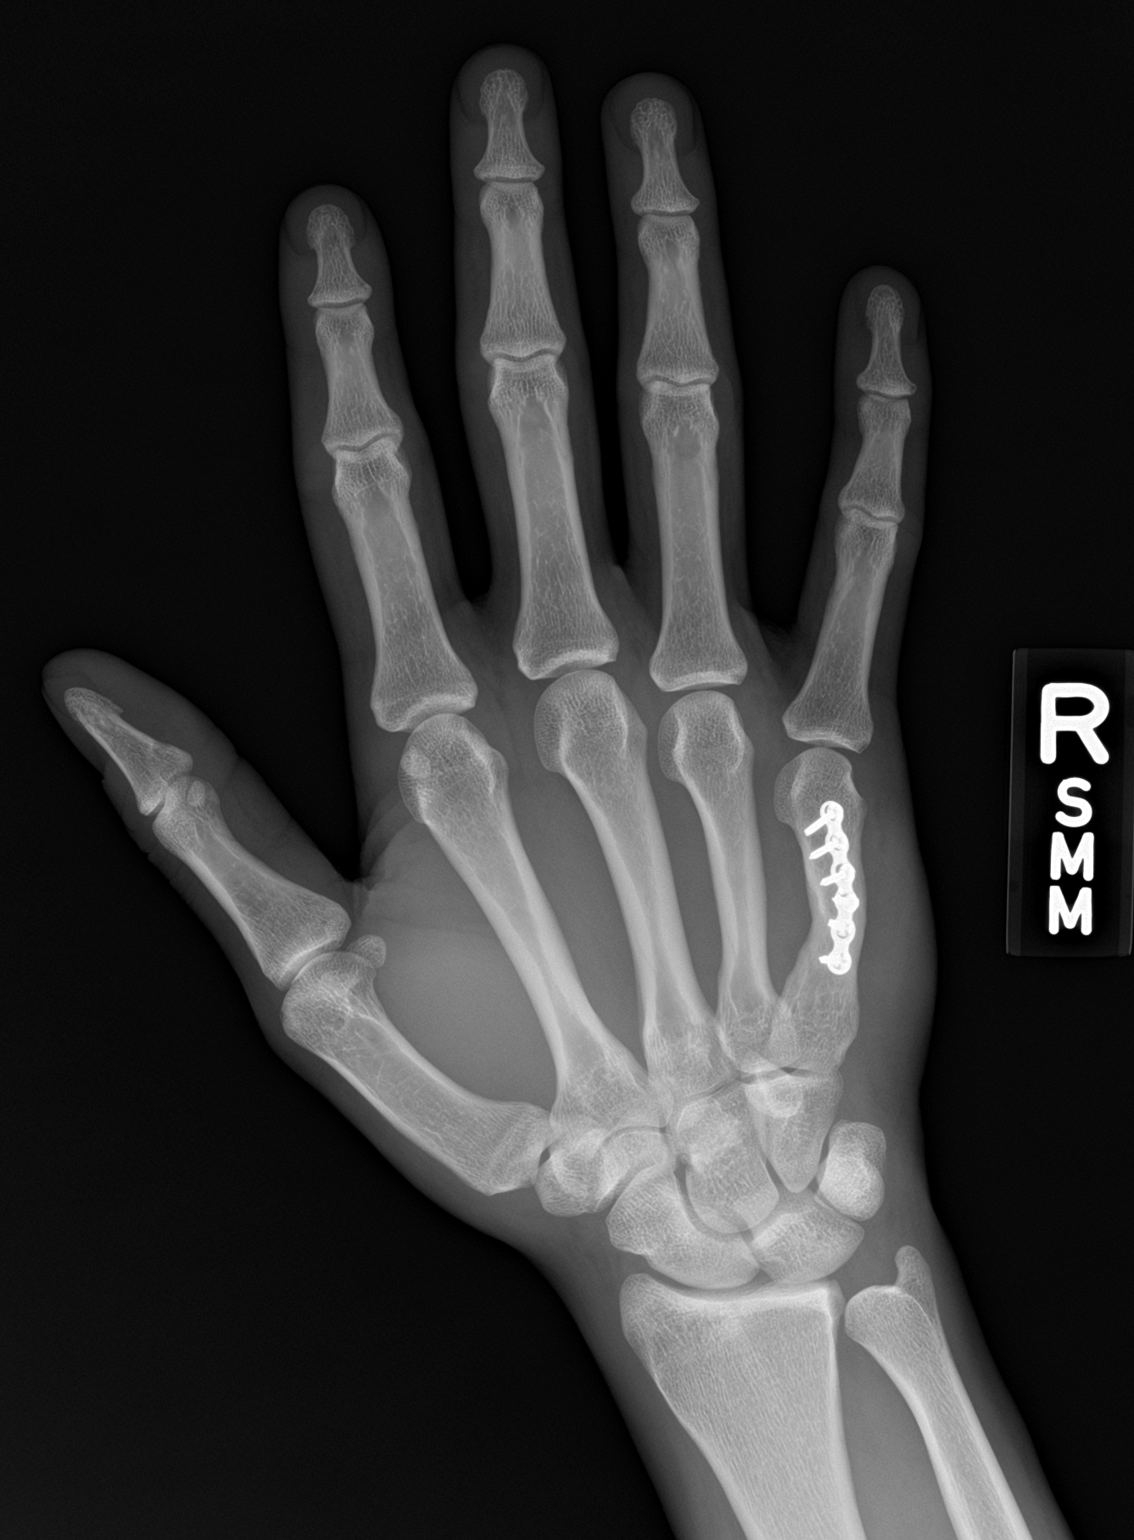

[hand obl]
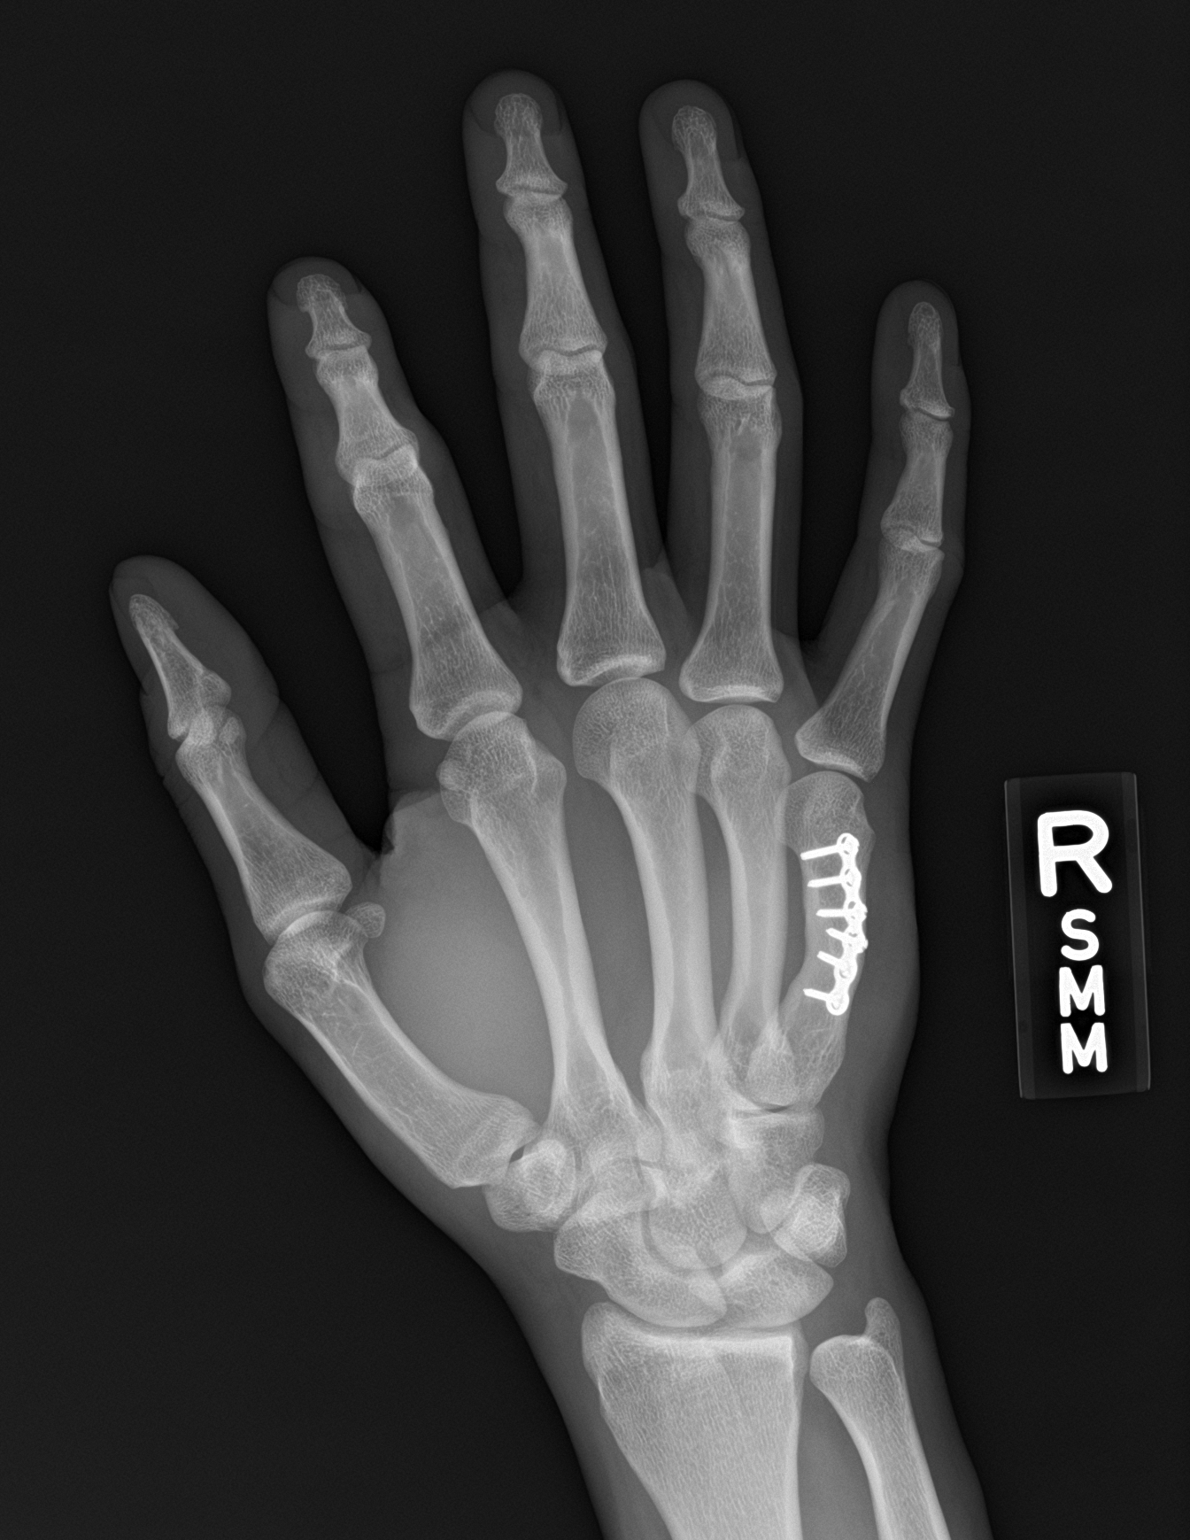

[hand lat]
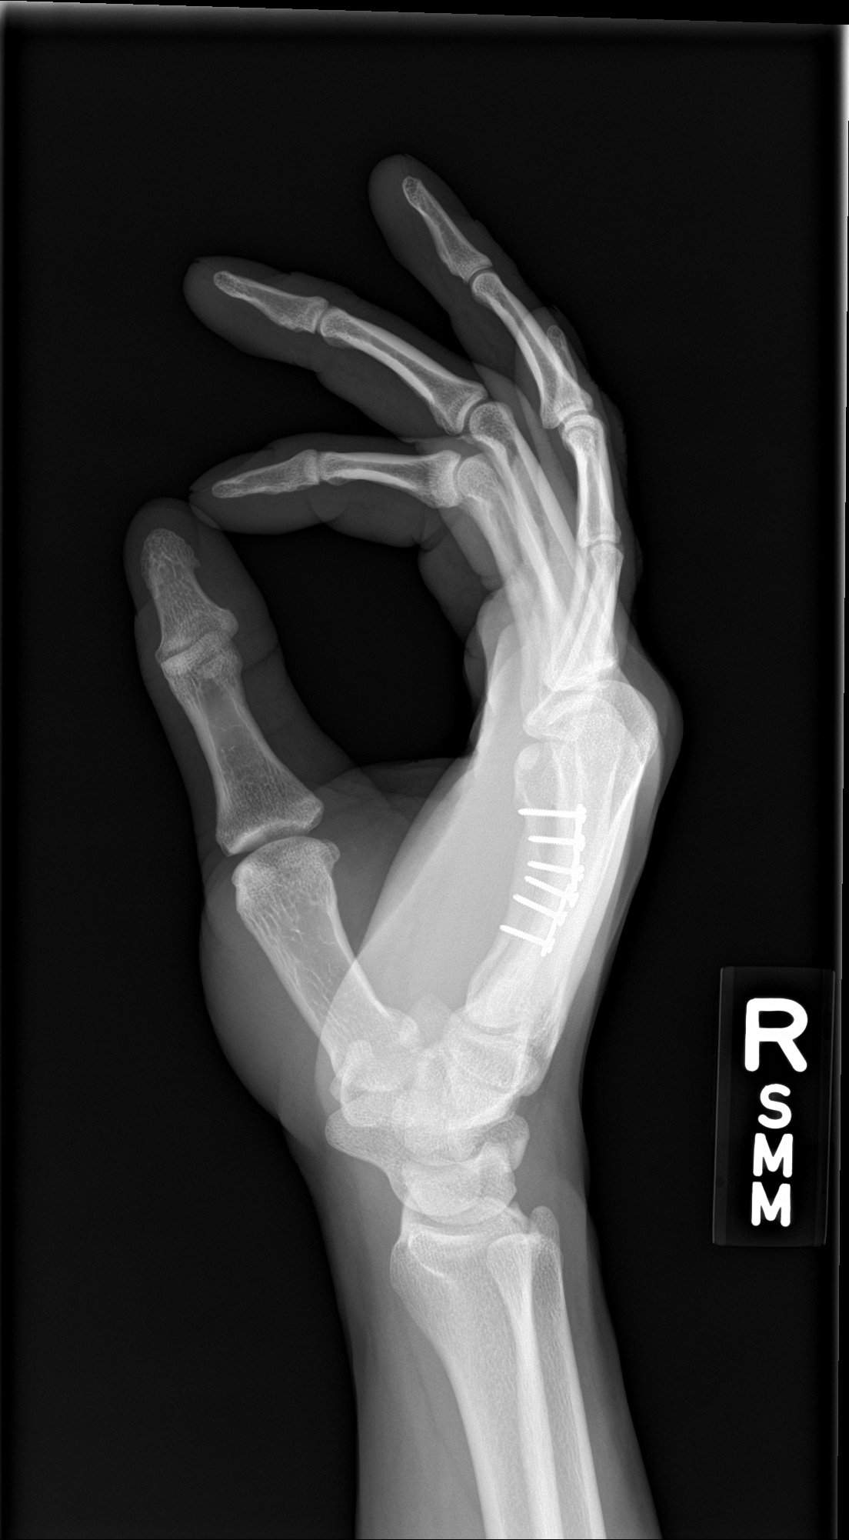

[3 of 3 positions shown; findings below may reference images not displayed]

FINDINGS: Interval healing of the previously demonstrated 5th metacarpal
fracture with screw and plate fixation. No acute fracture or
dislocation seen.
IMPRESSION: No acute fracture.

## 2022-06-28 ENCOUNTER — Encounter (HOSPITAL_COMMUNITY): Payer: Self-pay

## 2022-06-28 ENCOUNTER — Ambulatory Visit (HOSPITAL_COMMUNITY)
Admission: EM | Admit: 2022-06-28 | Discharge: 2022-06-28 | Disposition: A | Discharge status: Home / Self Care | Payer: Medicaid Other | Attending: Physician Assistant | Admitting: Physician Assistant

## 2022-06-28 DIAGNOSIS — J40 Bronchitis, not specified as acute or chronic: Secondary | ICD-10-CM

## 2022-06-28 DIAGNOSIS — J329 Chronic sinusitis, unspecified: Secondary | ICD-10-CM

## 2022-06-28 DIAGNOSIS — B370 Candidal stomatitis: Secondary | ICD-10-CM

## 2022-06-28 MED ORDER — NYSTATIN 100000 UNIT/ML MT SUSP: 5.0000 mL | Freq: Four times a day (QID) | OROMUCOSAL | 0 refills | Status: DC | PRN

## 2022-06-28 MED ORDER — AMOXICILLIN-POT CLAVULANATE 875-125 MG PO TABS: 1.0000 | ORAL_TABLET | Freq: Two times a day (BID) | ORAL | 0 refills | Status: DC

## 2022-06-28 NOTE — Discharge Instructions (Signed)
We are starting antibiotic to cover for sinus/bronchitis.  Take Augmentin twice daily for 7 days.  Use Magic mouthwash up to 4 times a day.  Gargle and spit this out.  Make sure that you are resting and drinking lots of fluid.  I do recommend you start your allergy medication.  If your symptoms or not improving or if anything worsens you need to be seen immediately.

## 2022-06-28 NOTE — ED Triage Notes (Signed)
Patient reports a sore throat x 1 1/2 months and an intermittent cough with yellow /green and sometimes blood-tinged sputum x 1 week.  Patient states he has been Dayquil and Robitussin. Patient has not taken any meds today for his symptoms

## 2022-06-28 NOTE — ED Provider Notes (Signed)
[place]    CSN: [id number] Arrival date & time: 06/28/22  1735      History   Chief Complaint Chief Complaint  Patient presents with   Sore Throat    HPI Marc Powers is a 27 y.o. male.   Patient presents today with a 6-week history of intermittent sore throat and cough.  Reports that over the past several weeks this has gotten worse and more persistent prompting evaluation.  He reports that cough is productive with thick colored sputum with occasional blood streaks.  Denies any frank hemoptysis.  He also reports sore throat and hoarseness that lasted for several weeks.  This is since improved without over-the-counter medication but he is unsure what would have caused this in the first place.  He has tried DayQuil and NyQuil without improvement.  Denies any recent antibiotics or steroids.  Denies history of diabetes, HIV, immunosuppression.  He does have a history of asthma as a child but is not currently taking any medication for this and has not required albuterol other medication in adulthood.  He has had COVID several years ago.  He has been able to work despite symptoms.  He is eating and drinking normally.    Past Medical History:  Diagnosis Date   Asthma    as child    There are no problems to display for this patient.   Past Surgical History:  Procedure Laterality Date   OPEN REDUCTION INTERNAL FIXATION (ORIF) METACARPAL Right 10/30/2014   Procedure: OPEN REDUCTION INTERNAL FIXATION (ORIF)RIGHT SMALL  METACARPAL;  Surgeon: Charlotte Crumb, MD;  Location: [facility];  Service: Orthopedics;  Laterality: Right;       Home Medications    Prior to Admission medications   Medication Sig Start Date End Date Taking? Authorizing Provider  amoxicillin-clavulanate (AUGMENTIN) 875-125 MG tablet Take 1 tablet by mouth every 12 (twelve) hours. 06/28/22  Yes Trishna Cwik, Derry Skill, PA-C  magic mouthwash (nystatin, lidocaine, diphenhydrAMINE) suspension  Take 5 mLs by mouth 4 (four) times daily as needed for mouth pain. Swish and spit 06/28/22  Yes Kehaulani Fruin, Derry Skill, PA-C    Family History Family History  Problem Relation Age of Onset   Hypertension Mother    Hypertension Father    Stroke Other     Social History Social History   Tobacco Use   Smoking status: Every Day    Types: Cigars   Smokeless tobacco: Never  Vaping Use   Vaping Use: Never used  Substance Use Topics   Alcohol use: Yes   Drug use: Yes    Types: Marijuana     Allergies   Patient has no known allergies.   Review of Systems Review of Systems  Constitutional:  Positive for activity change. Negative for appetite change, fatigue and fever.  HENT:  Positive for congestion, sinus pressure, sore throat, trouble swallowing and voice change. Negative for sneezing.   Respiratory:  Positive for cough. Negative for shortness of breath.   Cardiovascular:  Negative for chest pain.  Gastrointestinal:  Negative for abdominal pain, diarrhea, nausea and vomiting.  Neurological:  Negative for dizziness, light-headedness and headaches.     Physical Exam Triage Vital Signs ED Triage Vitals  Enc Vitals Group     BP 06/28/22 1908 (!) 147/92     Pulse Rate 06/28/22 1908 67     Resp 06/28/22 1908 16     Temp 06/28/22 1908 98.8 F (37.1 C)     Temp Source 06/28/22  1908 Oral     SpO2 06/28/22 1908 98 %     Weight --      Height --      Head Circumference --      Peak Flow --      Pain Score 06/28/22 1910 9     Pain Loc --      Pain Edu? --      Excl. in [place]? --    No data found.  Updated Vital Signs BP (!) 147/92 (BP Location: Right Arm)   Pulse 67   Temp 98.8 F (37.1 C) (Oral)   Resp 16   SpO2 98%   Visual Acuity Right Eye Distance:   Left Eye Distance:   Bilateral Distance:    Right Eye Near:   Left Eye Near:    Bilateral Near:     Physical Exam Vitals reviewed.  Constitutional:      General: He is awake.     Appearance: Normal appearance. He  is well-developed. He is not ill-appearing.     Comments: Very pleasant male appears stated age in no acute distress sitting comfortably in exam room  HENT:     Head: Normocephalic and atraumatic.     Right Ear: Tympanic membrane, ear canal and external ear normal. Tympanic membrane is not erythematous or bulging.     Left Ear: Tympanic membrane, ear canal and external ear normal. Tympanic membrane is not erythematous or bulging.     Nose:     Right Sinus: Maxillary sinus tenderness present. No frontal sinus tenderness.     Left Sinus: Maxillary sinus tenderness present. No frontal sinus tenderness.     Mouth/Throat:     Pharynx: Uvula midline. Posterior oropharyngeal erythema present. No oropharyngeal exudate.     Comments: Thick white plaques noted on tongue and buccal mucosa removable with tongue blade. Cardiovascular:     Rate and Rhythm: Normal rate and regular rhythm.     Heart sounds: Normal heart sounds, S1 normal and S2 normal. No murmur heard. Pulmonary:     Effort: Pulmonary effort is normal. No accessory muscle usage or respiratory distress.     Breath sounds: Normal breath sounds. No stridor. No wheezing, rhonchi or rales.     Comments: Clear to auscultation bilaterally Neurological:     Mental Status: He is alert.  Psychiatric:        Behavior: Behavior is cooperative.      UC Treatments / Results  Labs (all labs ordered are listed, but only abnormal results are displayed) Labs Reviewed - No data to display  EKG   Radiology No results found.  Procedures Procedures (including critical care time)  Medications Ordered in UC Medications - No data to display  Initial Impression / Assessment and Plan / UC Course  I have reviewed the triage vital signs and the nursing notes.  Pertinent labs & imaging results that were available during my care of the patient were reviewed by me and considered in my medical decision making (see chart for details).     Patient  is well-appearing, afebrile, nontoxic, nontachycardic.  No indication for viral testing as patient has been symptomatic for many weeks and this would not change management.  Chest x-ray was deferred as he has no adventitious lung sounds and oxygen saturation was 98% in clinic.  Will cover with Augmentin given recent worsening of symptoms.  I am also concern for oral thrush given his clinical presentation so we will start Magic mouthwash with  instruction to swish/gargle and spit up to 4 times a day.  He is to push fluids.  Can use over-the-counter medication.  Did recommend that he begin his xyzal as allergies could be contributing to symptoms.  Recommend with any rest and drink plenty of fluid.  Discussed that if his symptoms are improving within a week he should return for reevaluation.  If he has any worsening symptoms he needs to be seen immediately including fever, chest pain, shortness of breath, worsening sore throat, dysphagia.  Strict return precautions given.  Offered work excuse note which patient declined.  Final Clinical Impressions(s) / UC Diagnoses   Final diagnoses:  Sinobronchitis  Oral thrush     Discharge Instructions      We are starting antibiotic to cover for sinus/bronchitis.  Take Augmentin twice daily for 7 days.  Use Magic mouthwash up to 4 times a day.  Gargle and spit this out.  Make sure that you are resting and drinking lots of fluid.  I do recommend you start your allergy medication.  If your symptoms or not improving or if anything worsens you need to be seen immediately.     ED Prescriptions     Medication Sig Dispense Auth. Provider   amoxicillin-clavulanate (AUGMENTIN) 875-125 MG tablet Take 1 tablet by mouth every 12 (twelve) hours. 14 tablet Keontae Levingston K, PA-C   magic mouthwash (nystatin, lidocaine, diphenhydrAMINE) suspension Take 5 mLs by mouth 4 (four) times daily as needed for mouth pain. Swish and spit 180 mL Tyjon Bowen K, PA-C      PDMP not  reviewed this encounter.   Terrilee Croak, PA-C 06/28/22 1929

## 2022-06-30 ENCOUNTER — Telehealth (HOSPITAL_COMMUNITY): Payer: Self-pay

## 2022-06-30 MED ORDER — NYSTATIN 100000 UNIT/ML MT SUSP: 5.0000 mL | Freq: Four times a day (QID) | OROMUCOSAL | 0 refills | Status: DC | PRN

## 2022-06-30 NOTE — Telephone Encounter (Signed)
Patient calling in requesting mouthwash be sent to [facility] on [address] as [facility] no longer compounds medications.   Medication sent in to preferred pharmacy per protocol.

## 2022-09-28 ENCOUNTER — Ambulatory Visit (HOSPITAL_COMMUNITY)
Admission: EM | Admit: 2022-09-28 | Discharge: 2022-09-28 | Disposition: A | Discharge status: Home / Self Care | Payer: 59 | Attending: Urgent Care | Admitting: Urgent Care

## 2022-09-28 ENCOUNTER — Encounter (HOSPITAL_COMMUNITY): Payer: Self-pay

## 2022-09-28 DIAGNOSIS — L0202 Furuncle of face: Secondary | ICD-10-CM

## 2022-09-28 DIAGNOSIS — H02846 Edema of left eye, unspecified eyelid: Secondary | ICD-10-CM

## 2022-09-28 MED ORDER — AMOXICILLIN-POT CLAVULANATE 875-125 MG PO TABS: 1.0000 | ORAL_TABLET | Freq: Two times a day (BID) | ORAL | 0 refills | Status: AC

## 2022-09-28 MED ORDER — CLINDAMYCIN PHOS-BENZOYL PEROX 1.2-5 % EX GEL: 1.0000 "application " | Freq: Two times a day (BID) | CUTANEOUS | 0 refills | Status: DC

## 2022-09-28 MED ORDER — DIPHENHYDRAMINE HCL 25 MG PO TABS: 25.0000 mg | ORAL_TABLET | ORAL | 0 refills | Status: DC | PRN

## 2022-09-28 NOTE — ED Triage Notes (Signed)
Pt c/o abscess to lt temple area since Sunday. States last night popped it and a lot of drainage came out. States this morning has swelling to lt side of face and lt eye with tender to touch.

## 2022-09-28 NOTE — ED Provider Notes (Signed)
[facility]    CSN: [id number] Arrival date & time: 09/28/22  0831      History   Chief Complaint Chief Complaint  Patient presents with   Abscess    HPI Marc Powers is a 27 y.o. male.   Pleasant 27 year old male presents to due to concerns of a possible abscess to his left temple.  States it came to a head last evening and he tried squeezing it.  Reports a large amount of white pus came out.  He states that that lesion is pretty much resolved today, but notices significant swelling to the skin of his upper and lower left eyelids, and to his left temple.  He denies a fever.  He denies pain with range of motion of his eyes.  He denies pressure behind his eyes or ocular entrapment.  He denies redness of the skin. No vision change or photophobia.   Abscess   Past Medical History:  Diagnosis Date   Asthma    as child    There are no problems to display for this patient.   Past Surgical History:  Procedure Laterality Date   OPEN REDUCTION INTERNAL FIXATION (ORIF) METACARPAL Right 10/30/2014   Procedure: OPEN REDUCTION INTERNAL FIXATION (ORIF)RIGHT SMALL  METACARPAL;  Surgeon: Dairl Ponder, MD;  Location: [facility];  Service: Orthopedics;  Laterality: Right;       Home Medications    Prior to Admission medications   Medication Sig Start Date End Date Taking? Authorizing Provider  amoxicillin-clavulanate (AUGMENTIN) 875-125 MG tablet Take 1 tablet by mouth every 12 (twelve) hours for 7 days. 09/28/22 10/05/22 Yes Karlo Goeden L, PA  Clindamycin-Benzoyl Per, Refr, gel Apply 1 application  topically 2 (two) times daily. 09/28/22  Yes Analilia Geddis L, PA  diphenhydrAMINE (BENADRYL) 25 MG tablet Take 1 tablet (25 mg total) by mouth every 4 (four) hours as needed (swelling of eyelid). Sedation precaution 09/28/22  Yes Bethania Schlotzhauer, Jodelle Gross, PA    Family History Family History  Problem Relation Age of Onset   Hypertension Mother    Hypertension  Father    Stroke Other     Social History Social History   Tobacco Use   Smoking status: Every Day    Types: Cigars   Smokeless tobacco: Never  Vaping Use   Vaping Use: Never used  Substance Use Topics   Alcohol use: Yes   Drug use: Yes    Types: Marijuana     Allergies   Patient has no known allergies.   Review of Systems Review of Systems As per HPI  Physical Exam Triage Vital Signs ED Triage Vitals  Enc Vitals Group     BP 09/28/22 0947 (!) 142/91     Pulse Rate 09/28/22 0947 81     Resp 09/28/22 0947 18     Temp 09/28/22 0947 98.3 F (36.8 C)     Temp Source 09/28/22 0947 Oral     SpO2 09/28/22 0947 97 %     Weight --      Height --      Head Circumference --      Peak Flow --      Pain Score 09/28/22 0948 5     Pain Loc --      Pain Edu? --      Excl. in [place]? --    No data found.  Updated Vital Signs BP (!) 142/91 (BP Location: Left Arm)   Pulse 81   Temp  98.3 F (36.8 C) (Oral)   Resp 18   SpO2 97%   Visual Acuity Right Eye Distance:   Left Eye Distance:   Bilateral Distance:    Right Eye Near:   Left Eye Near:    Bilateral Near:     Physical Exam Vitals and nursing note reviewed.  Constitutional:      General: He is not in acute distress.    Appearance: Normal appearance. He is normal weight. He is not ill-appearing, toxic-appearing or diaphoretic.  HENT:     Head: Normocephalic and atraumatic.      Right Ear: Tympanic membrane, ear canal and external ear normal. There is no impacted cerumen.     Left Ear: Tympanic membrane, ear canal and external ear normal. There is no impacted cerumen.     Nose: Nose normal. No congestion or rhinorrhea.     Mouth/Throat:     Mouth: Mucous membranes are moist.     Pharynx: Oropharynx is clear. No oropharyngeal exudate or posterior oropharyngeal erythema.  Eyes:     General: Vision grossly intact. Gaze aligned appropriately. No allergic shiner, visual field deficit or scleral icterus.        Right eye: No foreign body, discharge or hordeolum.        Left eye: No foreign body, discharge or hordeolum.     Extraocular Movements: Extraocular movements intact.     Right eye: Normal extraocular motion and no nystagmus.     Left eye: Normal extraocular motion and no nystagmus.     Conjunctiva/sclera: Conjunctivae normal.     Right eye: Right conjunctiva is not injected. No chemosis, exudate or hemorrhage.    Left eye: Left conjunctiva is not injected. No chemosis, exudate or hemorrhage.     Comments: L upper and lower eyelid moderately edematous, (+) transillumination. NO warmth or erythema of the skin  Cardiovascular:     Rate and Rhythm: Normal rate and regular rhythm.     Pulses: Normal pulses.     Heart sounds: Normal heart sounds. No murmur heard.    No friction rub. No gallop.  Pulmonary:     Effort: Pulmonary effort is normal. No respiratory distress.     Breath sounds: Normal breath sounds. No stridor. No wheezing, rhonchi or rales.  Chest:     Chest wall: No tenderness.  Musculoskeletal:     Cervical back: Normal range of motion and neck supple. No rigidity or tenderness.  Lymphadenopathy:     Cervical: Cervical adenopathy (L sided anterior cervical chain) present.  Skin:    General: Skin is warm and dry.     Coloration: Skin is not jaundiced.     Findings: Lesion (single resolving boil to L temple, scabbed over; no active drainage. Scant swelling of surrounding skin without cellulitis) present. No erythema or rash.  Neurological:     General: No focal deficit present.     Mental Status: He is alert and oriented to person, place, and time.     Cranial Nerves: No cranial nerve deficit.      UC Treatments / Results  Labs (all labs ordered are listed, but only abnormal results are displayed) Labs Reviewed - No data to display  EKG   Radiology No results found.  Procedures Procedures (including critical care time)  Medications Ordered in UC Medications -  No data to display  Initial Impression / Assessment and Plan / UC Course  I have reviewed the triage vital signs and the nursing notes.  Pertinent labs & imaging results that were available during my care of the patient were reviewed by me and considered in my medical decision making (see chart for details).     Boil L temple -boil appears to be resolving after patient drained yesterday.  He does have a smaller noncomplicated boil just distal to that.  I will start him on p.o. Augmentin to cover for the instance of possible early developing preseptal cellulitis, although there is no current erythema or warmth on exam.  I will also start topical clindamycin benzyl peroxide gel to help cover for bacterial acne infection of the face. Swelling of L eyelid - reactive due to recent mechanical trauma to boil. There is no sign of infection of the eyelid, there is no sx consistent with orbital cellulitis. Will do PO benadryl PRN to help resolve the swelling. RTC precautions reviewed.   Final Clinical Impressions(s) / UC Diagnoses   Final diagnoses:  Boil, face  Swelling of eyelid, left     Discharge Instructions      Please start taking the Augmentin antibiotic by mouth twice daily.  Take this with food until gone.  To help with the eyelid swelling, take 1 tablet of Benadryl every 4-6 hours as needed.  Use with caution as this can make you tired or drowsy.  Use the clindamycin benzyl peroxide gel to the affected area of the face up to twice daily.  Do not get in the eye.  When wet, this can bleach fabric.  Wait until fully dried prior to laying on a pillow or changing your clothes. Use a white washcloth to prevent bleaching.  If you develop pain with movement of your eye, vision changes, or fever, head to the ER.     ED Prescriptions     Medication Sig Dispense Auth. Provider   diphenhydrAMINE (BENADRYL) 25 MG tablet Take 1 tablet (25 mg total) by mouth every 4 (four) hours as needed  (swelling of eyelid). Sedation precaution 30 tablet Toluwani Ruder L, PA   amoxicillin-clavulanate (AUGMENTIN) 875-125 MG tablet Take 1 tablet by mouth every 12 (twelve) hours for 7 days. 14 tablet Jazzmyne Rasnick L, PA   Clindamycin-Benzoyl Per, Refr, gel Apply 1 application  topically 2 (two) times daily. 45 g Zamirah Denny L, [place]      PDMP not reviewed this encounter.   Maretta Bees, [place] 09/28/22 1057

## 2022-09-28 NOTE — Discharge Instructions (Signed)
Please start taking the Augmentin antibiotic by mouth twice daily.  Take this with food until gone.  To help with the eyelid swelling, take 1 tablet of Benadryl every 4-6 hours as needed.  Use with caution as this can make you tired or drowsy.  Use the clindamycin benzyl peroxide gel to the affected area of the face up to twice daily.  Do not get in the eye.  When wet, this can bleach fabric.  Wait until fully dried prior to laying on a pillow or changing your clothes. Use a white washcloth to prevent bleaching.  If you develop pain with movement of your eye, vision changes, or fever, head to the ER.

## 2023-03-30 DIAGNOSIS — M79641 Pain in right hand: Secondary | ICD-10-CM | POA: Diagnosis not present

## 2023-03-30 DIAGNOSIS — S6991XA Unspecified injury of right wrist, hand and finger(s), initial encounter: Secondary | ICD-10-CM | POA: Diagnosis not present

## 2023-03-30 DIAGNOSIS — M7989 Other specified soft tissue disorders: Secondary | ICD-10-CM | POA: Diagnosis not present

## 2023-03-30 DIAGNOSIS — F17210 Nicotine dependence, cigarettes, uncomplicated: Secondary | ICD-10-CM | POA: Diagnosis not present

## 2023-03-30 DIAGNOSIS — W2203XA Walked into furniture, initial encounter: Secondary | ICD-10-CM | POA: Diagnosis not present

## 2023-03-30 DIAGNOSIS — S6981XA Other specified injuries of right wrist, hand and finger(s), initial encounter: Secondary | ICD-10-CM | POA: Diagnosis not present

## 2023-03-30 DIAGNOSIS — S62306A Unspecified fracture of fifth metacarpal bone, right hand, initial encounter for closed fracture: Secondary | ICD-10-CM | POA: Diagnosis not present

## 2024-05-16 ENCOUNTER — Inpatient Hospital Stay (HOSPITAL_COMMUNITY)
Admission: RE | Admit: 2024-05-16 | Discharge: 2024-05-16 | Disposition: A | Discharge status: Home / Self Care | Payer: Self-pay | Source: Ambulatory Visit

## 2024-05-25 ENCOUNTER — Ambulatory Visit (HOSPITAL_COMMUNITY)
Admission: RE | Admit: 2024-05-25 | Discharge: 2024-05-25 | Disposition: A | Discharge status: Home / Self Care | Payer: Self-pay | Source: Ambulatory Visit | Attending: Emergency Medicine | Admitting: Emergency Medicine

## 2024-05-25 ENCOUNTER — Encounter (HOSPITAL_COMMUNITY): Payer: Self-pay

## 2024-05-25 VITALS — BP 129/81 | HR 107 | Temp 99.4°F | Resp 16

## 2024-05-25 DIAGNOSIS — Z113 Encounter for screening for infections with a predominantly sexual mode of transmission: Secondary | ICD-10-CM | POA: Insufficient documentation

## 2024-05-25 DIAGNOSIS — R369 Urethral discharge, unspecified: Secondary | ICD-10-CM | POA: Insufficient documentation

## 2024-05-25 MED ORDER — CEFTRIAXONE SODIUM 500 MG IJ SOLR
500.0000 mg | INTRAMUSCULAR | Status: DC
  Administered 2024-05-25: 500 mg via INTRAMUSCULAR

## 2024-05-25 MED ORDER — LIDOCAINE HCL (PF) 1 % IJ SOLN
INTRAMUSCULAR | Status: AC
  Filled 2024-05-25: qty 2

## 2024-05-25 MED ORDER — CEFTRIAXONE SODIUM 500 MG IJ SOLR
INTRAMUSCULAR | Status: AC
  Filled 2024-05-25: qty 500

## 2024-05-25 NOTE — Discharge Instructions (Signed)
 You were given injection of Rocephin  in clinic today for treatment of possible gonorrhea. Your results will return in the next few days and someone call if results require any additional treatment.  Your results will also be available on MyChart. Follow-up with your primary care provider or return here as needed.

## 2024-05-25 NOTE — ED Provider Notes (Signed)
 " [facility]    CSN: [id number] Arrival date & time: 05/25/24  1602      History   Chief Complaint Chief Complaint  Patient presents with   SEXUALLY TRANSMITTED DISEASE    Discharge from penis - Entered by patient    HPI Marc Powers is a 29 y.o. male.   Patient presents with penile discharge and mild discomfort with urinating that began approximately 1 week ago.  Patient reports that he is sexually active.  Patient denies any known exposures to STDs.  Patient denies any hematuria, urinary frequency/urgency, abdominal pain, flank pain, or fever.  Patient did express some concerns for being able to return for any treatment if needed due to his work schedule.  The history is provided by the patient and medical records.    Past Medical History:  Diagnosis Date   Asthma    as child    There are no active problems to display for this patient.   Past Surgical History:  Procedure Laterality Date   OPEN REDUCTION INTERNAL FIXATION (ORIF) METACARPAL Right 10/30/2014   Procedure: OPEN REDUCTION INTERNAL FIXATION (ORIF)RIGHT SMALL  METACARPAL;  Surgeon: Donnice Robinsons, MD;  Location: [facility];  Service: Orthopedics;  Laterality: Right;       Home Medications    Prior to Admission medications  Not on File    Family History Family History  Problem Relation Age of Onset   Hypertension Mother    Hypertension Father    Stroke Other     Social History Social History[1]   Allergies   Patient has no known allergies.   Review of Systems Review of Systems  Per HPI  Physical Exam Triage Vital Signs ED Triage Vitals [05/25/24 1645]  Encounter Vitals Group     BP 129/81     Girls Systolic BP Percentile      Girls Diastolic BP Percentile      Boys Systolic BP Percentile      Boys Diastolic BP Percentile      Pulse Rate (!) 107     Resp 16     Temp 99.4 F (37.4 C)     Temp Source Oral     SpO2 96 %     Weight      Height       Head Circumference      Peak Flow      Pain Score 0     Pain Loc      Pain Education      Exclude from Growth Chart    No data found.  Updated Vital Signs BP 129/81 (BP Location: Right Arm)   Pulse (!) 107   Temp 99.4 F (37.4 C) (Oral)   Resp 16   SpO2 96%   Visual Acuity Right Eye Distance:   Left Eye Distance:   Bilateral Distance:    Right Eye Near:   Left Eye Near:    Bilateral Near:     Physical Exam Vitals and nursing note reviewed.  Constitutional:      General: He is awake. He is not in acute distress.    Appearance: Normal appearance. He is well-developed and well-groomed. He is not ill-appearing.  Genitourinary:    Comments: Exam deferred Neurological:     General: No focal deficit present.     Mental Status: He is alert and oriented to person, place, and time. Mental status is at baseline.  Psychiatric:  Behavior: Behavior is cooperative.      UC Treatments / Results  Labs (all labs ordered are listed, but only abnormal results are displayed) Labs Reviewed  CYTOLOGY, (ORAL, ANAL, URETHRAL) ANCILLARY ONLY    EKG   Radiology No results found.  Procedures Procedures (including critical care time)  Medications Ordered in UC Medications  cefTRIAXone  (ROCEPHIN ) injection 500 mg (has no administration in time range)    Initial Impression / Assessment and Plan / UC Course  I have reviewed the triage vital signs and the nursing notes.  Pertinent labs & imaging results that were available during my care of the patient were reviewed by me and considered in my medical decision making (see chart for details).     Patient is overall well-appearing.  Vitals are stable.  GU exam deferred.  Patient perform self swab for STD.  HIV and RPR declined.  Empirically treating for gonorrhea with injection of Rocephin  due to presence of active penile discharge and concerns regarding being able to return for treatment if needed.  Discussed follow-up  and return precautions. Final Clinical Impressions(s) / UC Diagnoses   Final diagnoses:  Penile discharge  Screen for STD (sexually transmitted disease)     Discharge Instructions      You were given injection of Rocephin  in clinic today for treatment of possible gonorrhea. Your results will return in the next few days and someone call if results require any additional treatment.  Your results will also be available on MyChart. Follow-up with your primary care provider or return here as needed.   ED Prescriptions   None    PDMP not reviewed this encounter.    [1]  Social History Tobacco Use   Smoking status: Every Day    Types: Cigars   Smokeless tobacco: Never  Vaping Use   Vaping status: Never Used  Substance Use Topics   Alcohol use: Yes   Drug use: Yes    Types: Marijuana     Johnie Rumaldo LABOR, NP 05/25/24 1702  "

## 2024-05-25 NOTE — ED Triage Notes (Signed)
 Patient here today with c/o penile discharge and burning in urination X 1 week.

## 2024-05-29 ENCOUNTER — Ambulatory Visit (HOSPITAL_COMMUNITY): Payer: Self-pay

## 2024-05-29 LAB — CYTOLOGY, (ORAL, ANAL, URETHRAL) ANCILLARY ONLY
Chlamydia: NEGATIVE
Comment: NEGATIVE
Comment: NEGATIVE
Comment: NORMAL
Neisseria Gonorrhea: POSITIVE — AB
Trichomonas: POSITIVE — AB

## 2024-05-29 MED ORDER — METRONIDAZOLE 500 MG PO TABS: 2000.0000 mg | ORAL_TABLET | Freq: Once | ORAL | 0 refills | Status: AC
